# Patient Record
Sex: Female | Born: 1935 | Race: Black or African American | Hispanic: No
Health system: Southern US, Community
[De-identification: ages and names within clinical notes are randomized; demographics above are authoritative.]

---

## 2005-01-23 ENCOUNTER — Encounter: Admission: RE | Admit: 2005-01-23 | Discharge: 2005-01-23 | Payer: Self-pay | Admitting: General Surgery

## 2005-08-18 ENCOUNTER — Encounter: Admission: RE | Admit: 2005-08-18 | Discharge: 2005-08-18 | Payer: Self-pay | Admitting: General Surgery

## 2005-12-22 ENCOUNTER — Encounter: Admission: RE | Admit: 2005-12-22 | Discharge: 2005-12-22 | Payer: Self-pay | Admitting: General Surgery

## 2011-07-15 DIAGNOSIS — E785 Hyperlipidemia, unspecified: Secondary | ICD-10-CM | POA: Diagnosis not present

## 2011-07-15 DIAGNOSIS — D649 Anemia, unspecified: Secondary | ICD-10-CM | POA: Diagnosis not present

## 2011-07-15 DIAGNOSIS — E669 Obesity, unspecified: Secondary | ICD-10-CM | POA: Diagnosis not present

## 2011-09-01 DIAGNOSIS — D649 Anemia, unspecified: Secondary | ICD-10-CM | POA: Diagnosis not present

## 2011-09-01 DIAGNOSIS — M19079 Primary osteoarthritis, unspecified ankle and foot: Secondary | ICD-10-CM | POA: Diagnosis not present

## 2011-09-01 DIAGNOSIS — R0609 Other forms of dyspnea: Secondary | ICD-10-CM | POA: Diagnosis not present

## 2011-09-01 DIAGNOSIS — R0989 Other specified symptoms and signs involving the circulatory and respiratory systems: Secondary | ICD-10-CM | POA: Diagnosis not present

## 2011-09-01 DIAGNOSIS — I10 Essential (primary) hypertension: Secondary | ICD-10-CM | POA: Diagnosis not present

## 2011-09-01 DIAGNOSIS — M2550 Pain in unspecified joint: Secondary | ICD-10-CM | POA: Diagnosis not present

## 2011-09-05 DIAGNOSIS — D649 Anemia, unspecified: Secondary | ICD-10-CM | POA: Diagnosis not present

## 2011-09-05 DIAGNOSIS — D510 Vitamin B12 deficiency anemia due to intrinsic factor deficiency: Secondary | ICD-10-CM | POA: Diagnosis not present

## 2011-09-05 DIAGNOSIS — R7881 Bacteremia: Secondary | ICD-10-CM | POA: Diagnosis not present

## 2011-09-05 DIAGNOSIS — K5900 Constipation, unspecified: Secondary | ICD-10-CM | POA: Diagnosis not present

## 2011-09-05 DIAGNOSIS — M7989 Other specified soft tissue disorders: Secondary | ICD-10-CM | POA: Diagnosis not present

## 2011-09-12 DIAGNOSIS — M79609 Pain in unspecified limb: Secondary | ICD-10-CM | POA: Diagnosis not present

## 2011-09-12 DIAGNOSIS — M7989 Other specified soft tissue disorders: Secondary | ICD-10-CM | POA: Diagnosis not present

## 2011-09-12 DIAGNOSIS — I998 Other disorder of circulatory system: Secondary | ICD-10-CM | POA: Diagnosis not present

## 2011-09-12 DIAGNOSIS — Z79899 Other long term (current) drug therapy: Secondary | ICD-10-CM | POA: Diagnosis not present

## 2011-09-12 DIAGNOSIS — L539 Erythematous condition, unspecified: Secondary | ICD-10-CM | POA: Diagnosis not present

## 2011-09-12 DIAGNOSIS — I10 Essential (primary) hypertension: Secondary | ICD-10-CM | POA: Diagnosis not present

## 2011-09-12 DIAGNOSIS — M7730 Calcaneal spur, unspecified foot: Secondary | ICD-10-CM | POA: Diagnosis not present

## 2011-11-10 DIAGNOSIS — D649 Anemia, unspecified: Secondary | ICD-10-CM | POA: Diagnosis not present

## 2011-11-10 DIAGNOSIS — E559 Vitamin D deficiency, unspecified: Secondary | ICD-10-CM | POA: Diagnosis not present

## 2011-11-25 DIAGNOSIS — I10 Essential (primary) hypertension: Secondary | ICD-10-CM | POA: Diagnosis not present

## 2011-11-25 DIAGNOSIS — D649 Anemia, unspecified: Secondary | ICD-10-CM | POA: Diagnosis not present

## 2011-11-25 DIAGNOSIS — F0390 Unspecified dementia without behavioral disturbance: Secondary | ICD-10-CM | POA: Diagnosis not present

## 2011-12-01 DIAGNOSIS — M129 Arthropathy, unspecified: Secondary | ICD-10-CM | POA: Diagnosis not present

## 2011-12-01 DIAGNOSIS — E785 Hyperlipidemia, unspecified: Secondary | ICD-10-CM | POA: Diagnosis not present

## 2011-12-01 DIAGNOSIS — I10 Essential (primary) hypertension: Secondary | ICD-10-CM | POA: Diagnosis not present

## 2012-05-24 DIAGNOSIS — M25569 Pain in unspecified knee: Secondary | ICD-10-CM | POA: Diagnosis not present

## 2012-05-24 DIAGNOSIS — M1710 Unilateral primary osteoarthritis, unspecified knee: Secondary | ICD-10-CM | POA: Diagnosis not present

## 2012-05-24 DIAGNOSIS — Z23 Encounter for immunization: Secondary | ICD-10-CM | POA: Diagnosis not present

## 2012-05-24 DIAGNOSIS — I10 Essential (primary) hypertension: Secondary | ICD-10-CM | POA: Diagnosis not present

## 2012-05-24 DIAGNOSIS — Z0000 Encounter for general adult medical examination without abnormal findings: Secondary | ICD-10-CM | POA: Diagnosis not present

## 2012-05-24 DIAGNOSIS — N189 Chronic kidney disease, unspecified: Secondary | ICD-10-CM | POA: Diagnosis not present

## 2012-05-27 DIAGNOSIS — Z23 Encounter for immunization: Secondary | ICD-10-CM | POA: Diagnosis not present

## 2012-05-27 DIAGNOSIS — M25569 Pain in unspecified knee: Secondary | ICD-10-CM | POA: Diagnosis not present

## 2012-05-27 DIAGNOSIS — M1710 Unilateral primary osteoarthritis, unspecified knee: Secondary | ICD-10-CM | POA: Diagnosis not present

## 2012-12-09 DIAGNOSIS — M25569 Pain in unspecified knee: Secondary | ICD-10-CM | POA: Diagnosis not present

## 2012-12-09 DIAGNOSIS — Z0000 Encounter for general adult medical examination without abnormal findings: Secondary | ICD-10-CM | POA: Diagnosis not present

## 2012-12-09 DIAGNOSIS — M1710 Unilateral primary osteoarthritis, unspecified knee: Secondary | ICD-10-CM | POA: Diagnosis not present

## 2012-12-09 DIAGNOSIS — I10 Essential (primary) hypertension: Secondary | ICD-10-CM | POA: Diagnosis not present

## 2012-12-09 DIAGNOSIS — M810 Age-related osteoporosis without current pathological fracture: Secondary | ICD-10-CM | POA: Diagnosis not present

## 2012-12-09 DIAGNOSIS — N189 Chronic kidney disease, unspecified: Secondary | ICD-10-CM | POA: Diagnosis not present

## 2012-12-09 DIAGNOSIS — E7800 Pure hypercholesterolemia, unspecified: Secondary | ICD-10-CM | POA: Diagnosis not present

## 2013-03-11 DIAGNOSIS — M25569 Pain in unspecified knee: Secondary | ICD-10-CM | POA: Diagnosis not present

## 2013-03-11 DIAGNOSIS — E7800 Pure hypercholesterolemia, unspecified: Secondary | ICD-10-CM | POA: Diagnosis not present

## 2013-03-11 DIAGNOSIS — I10 Essential (primary) hypertension: Secondary | ICD-10-CM | POA: Diagnosis not present

## 2013-03-11 DIAGNOSIS — E559 Vitamin D deficiency, unspecified: Secondary | ICD-10-CM | POA: Diagnosis not present

## 2013-03-11 DIAGNOSIS — N189 Chronic kidney disease, unspecified: Secondary | ICD-10-CM | POA: Diagnosis not present

## 2013-07-08 DIAGNOSIS — N189 Chronic kidney disease, unspecified: Secondary | ICD-10-CM | POA: Diagnosis not present

## 2013-07-08 DIAGNOSIS — R0602 Shortness of breath: Secondary | ICD-10-CM | POA: Diagnosis not present

## 2013-07-08 DIAGNOSIS — I10 Essential (primary) hypertension: Secondary | ICD-10-CM | POA: Diagnosis not present

## 2013-07-08 DIAGNOSIS — R609 Edema, unspecified: Secondary | ICD-10-CM | POA: Diagnosis not present

## 2013-07-08 DIAGNOSIS — F0390 Unspecified dementia without behavioral disturbance: Secondary | ICD-10-CM | POA: Diagnosis not present

## 2013-07-29 ENCOUNTER — Ambulatory Visit
Admission: RE | Admit: 2013-07-29 | Discharge: 2013-07-29 | Disposition: A | Discharge status: Home / Self Care | Payer: Medicare Other | Source: Ambulatory Visit | Attending: Nephrology | Admitting: Nephrology

## 2013-07-29 ENCOUNTER — Other Ambulatory Visit: Payer: Self-pay | Admitting: Nephrology

## 2013-07-29 DIAGNOSIS — E1129 Type 2 diabetes mellitus with other diabetic kidney complication: Secondary | ICD-10-CM | POA: Diagnosis not present

## 2013-07-29 DIAGNOSIS — R809 Proteinuria, unspecified: Secondary | ICD-10-CM | POA: Diagnosis not present

## 2013-07-29 DIAGNOSIS — N183 Chronic kidney disease, stage 3 unspecified: Secondary | ICD-10-CM

## 2013-07-29 DIAGNOSIS — I129 Hypertensive chronic kidney disease with stage 1 through stage 4 chronic kidney disease, or unspecified chronic kidney disease: Secondary | ICD-10-CM | POA: Diagnosis not present

## 2013-08-05 DIAGNOSIS — E559 Vitamin D deficiency, unspecified: Secondary | ICD-10-CM | POA: Diagnosis not present

## 2013-08-05 DIAGNOSIS — I129 Hypertensive chronic kidney disease with stage 1 through stage 4 chronic kidney disease, or unspecified chronic kidney disease: Secondary | ICD-10-CM | POA: Diagnosis not present

## 2013-08-05 DIAGNOSIS — N183 Chronic kidney disease, stage 3 unspecified: Secondary | ICD-10-CM | POA: Diagnosis not present

## 2014-06-28 DIAGNOSIS — N183 Chronic kidney disease, stage 3 (moderate): Secondary | ICD-10-CM | POA: Diagnosis not present

## 2014-06-28 DIAGNOSIS — I129 Hypertensive chronic kidney disease with stage 1 through stage 4 chronic kidney disease, or unspecified chronic kidney disease: Secondary | ICD-10-CM | POA: Diagnosis not present

## 2014-06-28 DIAGNOSIS — D631 Anemia in chronic kidney disease: Secondary | ICD-10-CM | POA: Diagnosis not present

## 2014-06-28 DIAGNOSIS — R809 Proteinuria, unspecified: Secondary | ICD-10-CM | POA: Diagnosis not present

## 2014-07-10 IMAGING — US US RENAL
1 series · 14 of 25 positions shown · non-contrast
Comparison: None.

CLINICAL DATA: Chronic kidney disease stage 3

EXAM:
RENAL/URINARY TRACT ULTRASOUND COMPLETE

[Series 1: us renal · 0.24mm/px · 14 of 26 slices shown]
[im 1/26]
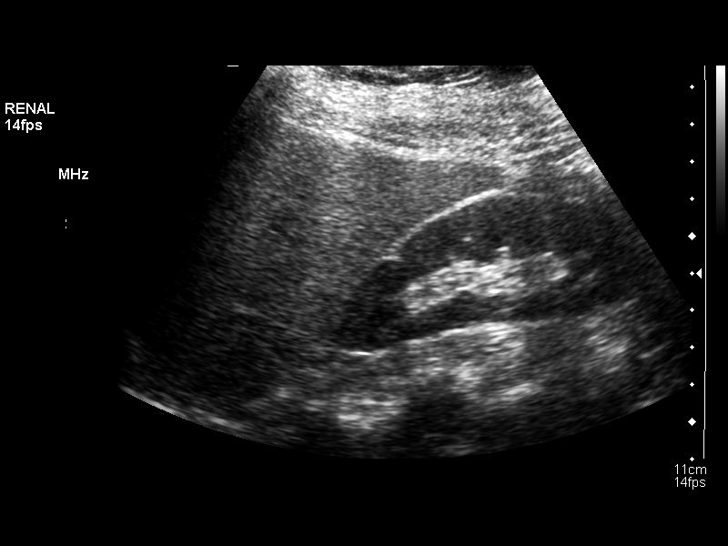
[im 3/26]
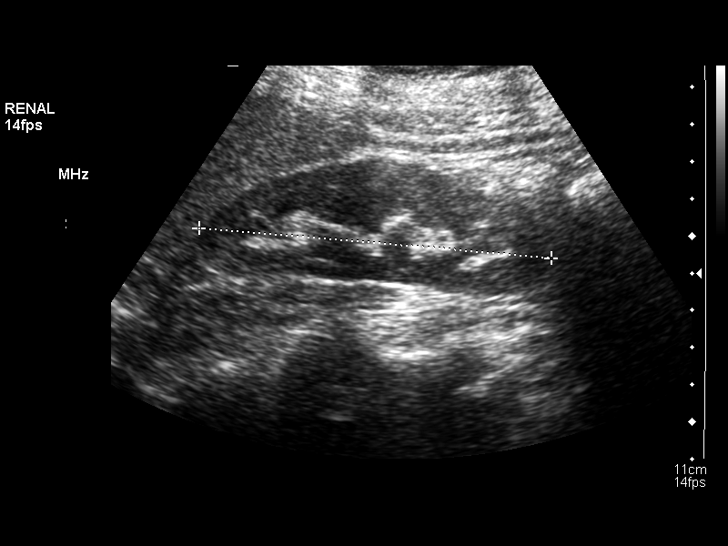
[im 5/26]
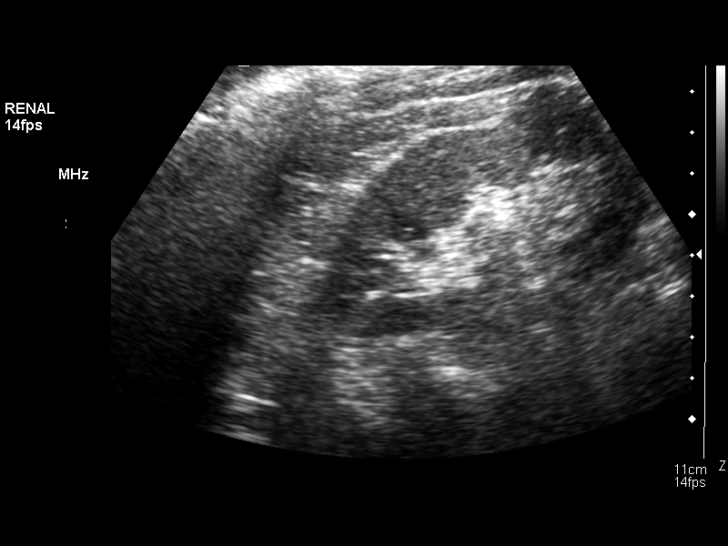
[im 7/26]
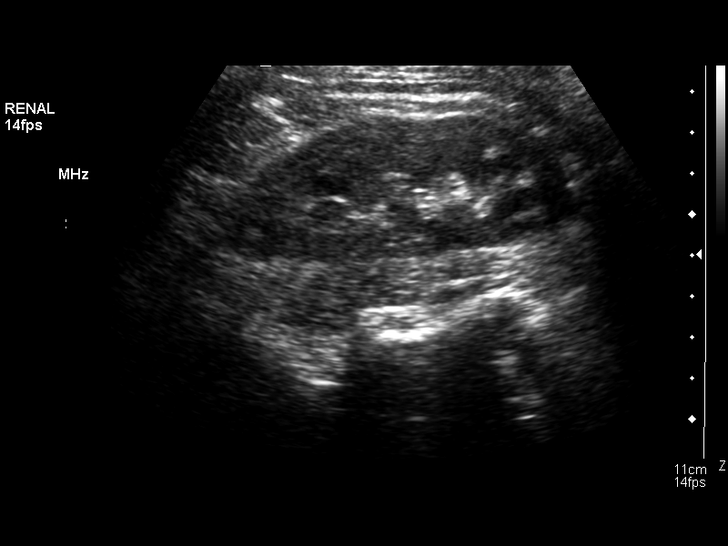
[im 9/26]
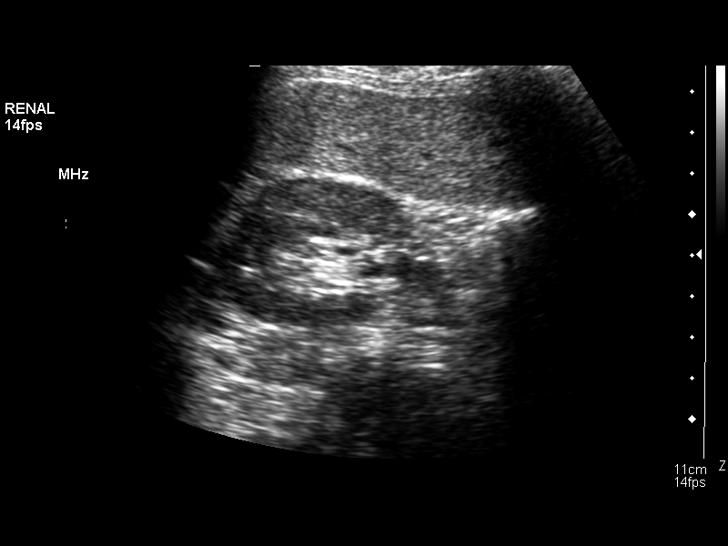
[im 10/26]
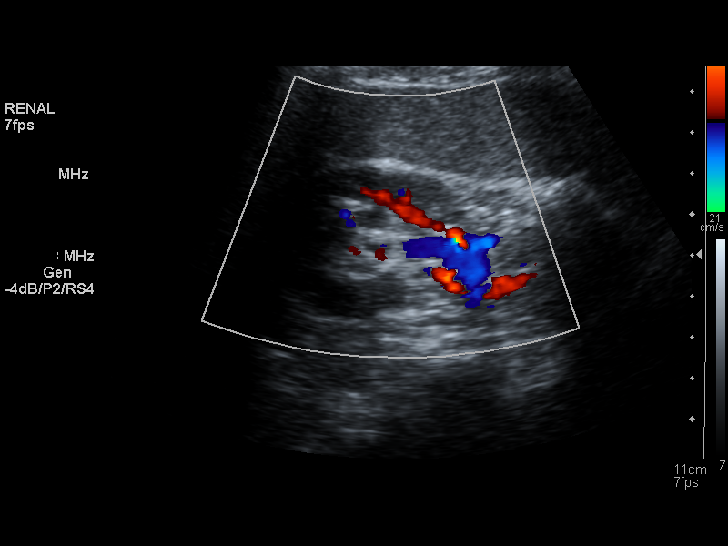
[im 12/26]
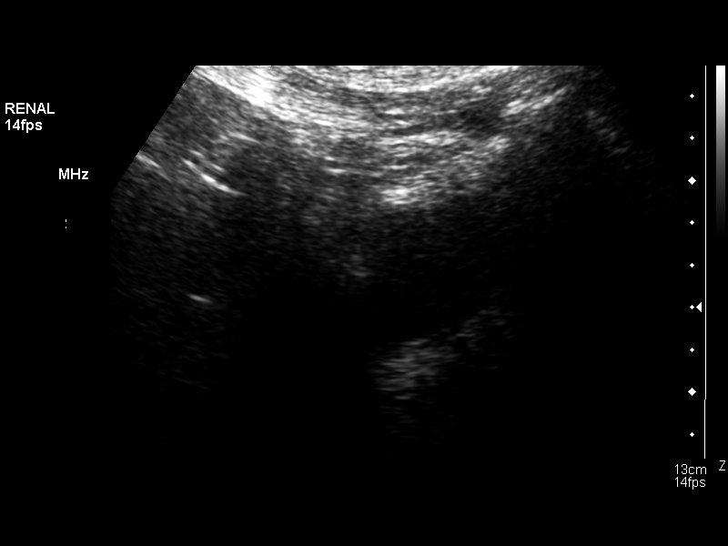
[im 14/26]
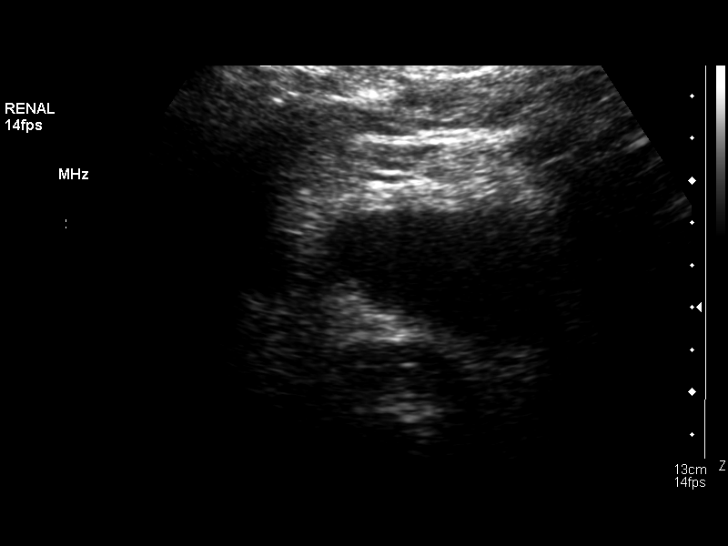
[im 16/26]
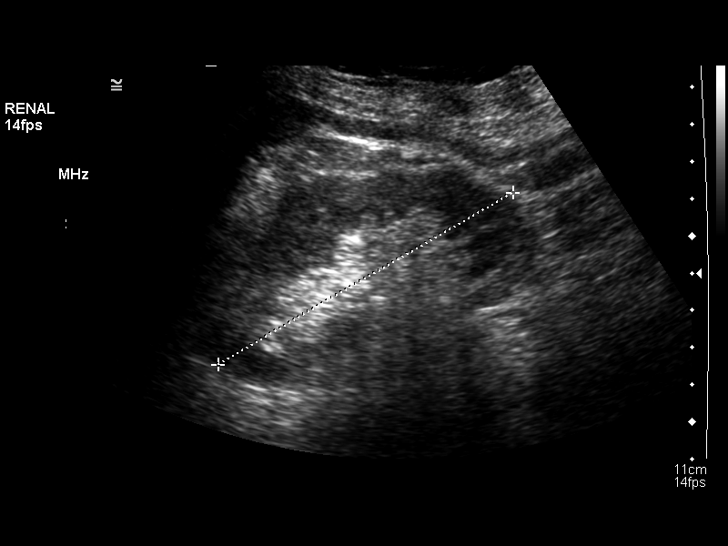
[im 17/26]
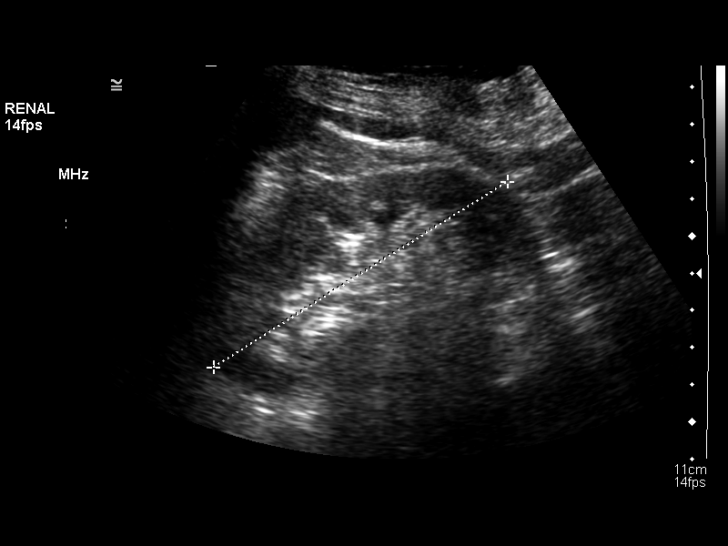
[im 19/26]
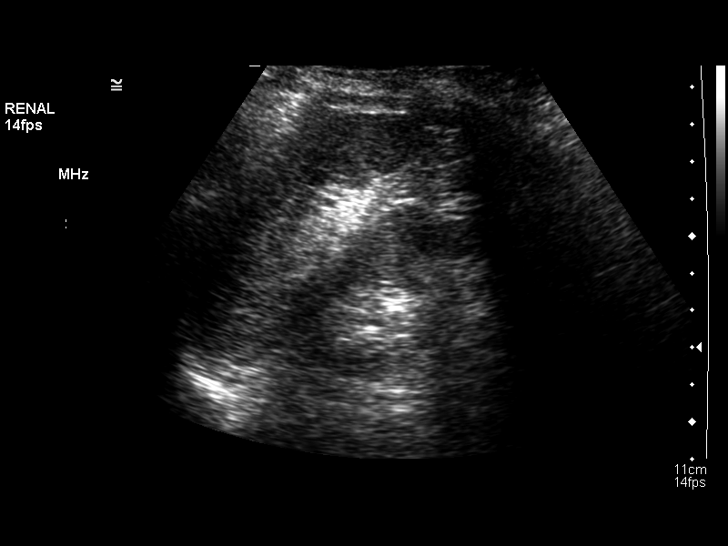
[im 21/26]
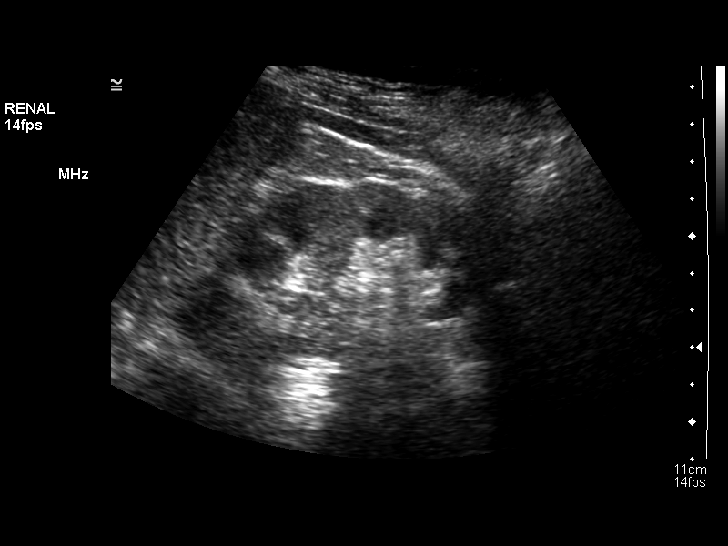
[im 23/26]
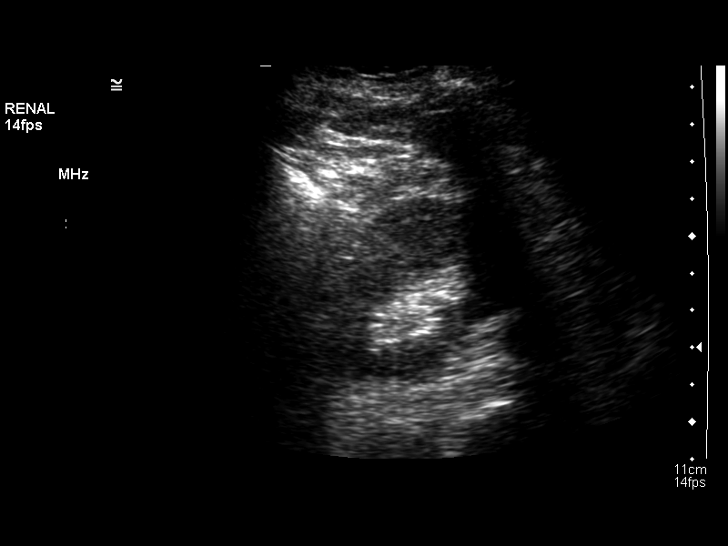
[im 26/26]
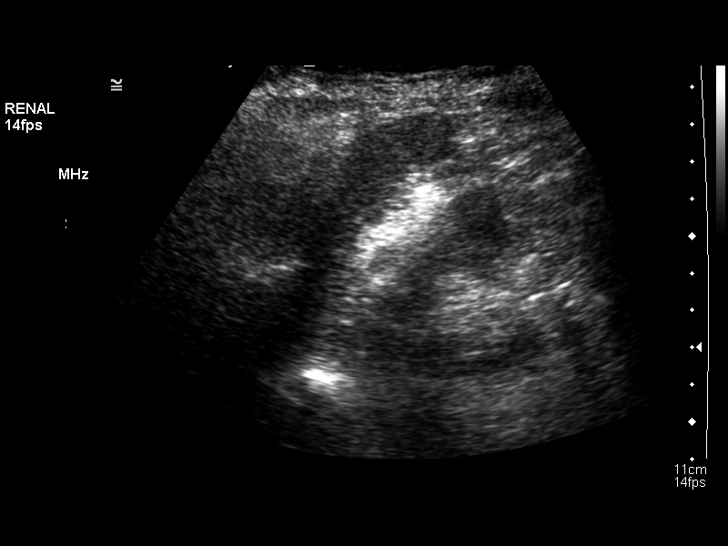

[14 of 25 positions shown; findings below may reference images not displayed]

FINDINGS: Right Kidney:

Length: 9.7 cm.  No mass or hydronephrosis.

Left Kidney:

Length: 9.4 cm.  No mass or hydronephrosis.

Bladder:

Underdistended.
IMPRESSION: Negative renal ultrasound.

## 2014-09-18 DIAGNOSIS — E780 Pure hypercholesterolemia: Secondary | ICD-10-CM | POA: Diagnosis not present

## 2014-09-18 DIAGNOSIS — N189 Chronic kidney disease, unspecified: Secondary | ICD-10-CM | POA: Diagnosis not present

## 2014-09-18 DIAGNOSIS — I10 Essential (primary) hypertension: Secondary | ICD-10-CM | POA: Diagnosis not present

## 2014-09-18 DIAGNOSIS — I129 Hypertensive chronic kidney disease with stage 1 through stage 4 chronic kidney disease, or unspecified chronic kidney disease: Secondary | ICD-10-CM | POA: Diagnosis not present

## 2014-09-18 DIAGNOSIS — R0602 Shortness of breath: Secondary | ICD-10-CM | POA: Diagnosis not present

## 2014-09-18 DIAGNOSIS — F0390 Unspecified dementia without behavioral disturbance: Secondary | ICD-10-CM | POA: Diagnosis not present

## 2015-03-02 DIAGNOSIS — R809 Proteinuria, unspecified: Secondary | ICD-10-CM | POA: Diagnosis not present

## 2015-03-02 DIAGNOSIS — I129 Hypertensive chronic kidney disease with stage 1 through stage 4 chronic kidney disease, or unspecified chronic kidney disease: Secondary | ICD-10-CM | POA: Diagnosis not present

## 2015-03-02 DIAGNOSIS — D631 Anemia in chronic kidney disease: Secondary | ICD-10-CM | POA: Diagnosis not present

## 2015-03-02 DIAGNOSIS — N2581 Secondary hyperparathyroidism of renal origin: Secondary | ICD-10-CM | POA: Diagnosis not present

## 2015-03-02 DIAGNOSIS — N189 Chronic kidney disease, unspecified: Secondary | ICD-10-CM | POA: Diagnosis not present

## 2015-03-02 DIAGNOSIS — N183 Chronic kidney disease, stage 3 (moderate): Secondary | ICD-10-CM | POA: Diagnosis not present

## 2015-03-02 DIAGNOSIS — E1129 Type 2 diabetes mellitus with other diabetic kidney complication: Secondary | ICD-10-CM | POA: Diagnosis not present

## 2015-03-15 DIAGNOSIS — F0390 Unspecified dementia without behavioral disturbance: Secondary | ICD-10-CM | POA: Diagnosis not present

## 2015-03-15 DIAGNOSIS — I10 Essential (primary) hypertension: Secondary | ICD-10-CM | POA: Diagnosis not present

## 2015-03-15 DIAGNOSIS — Z23 Encounter for immunization: Secondary | ICD-10-CM | POA: Diagnosis not present

## 2015-09-13 DIAGNOSIS — N183 Chronic kidney disease, stage 3 (moderate): Secondary | ICD-10-CM | POA: Diagnosis not present

## 2015-09-13 DIAGNOSIS — G309 Alzheimer's disease, unspecified: Secondary | ICD-10-CM | POA: Diagnosis not present

## 2015-09-13 DIAGNOSIS — I10 Essential (primary) hypertension: Secondary | ICD-10-CM | POA: Diagnosis not present

## 2015-09-13 DIAGNOSIS — N189 Chronic kidney disease, unspecified: Secondary | ICD-10-CM | POA: Diagnosis not present

## 2015-11-08 DIAGNOSIS — R809 Proteinuria, unspecified: Secondary | ICD-10-CM | POA: Diagnosis not present

## 2015-11-08 DIAGNOSIS — N2581 Secondary hyperparathyroidism of renal origin: Secondary | ICD-10-CM | POA: Diagnosis not present

## 2015-11-08 DIAGNOSIS — N189 Chronic kidney disease, unspecified: Secondary | ICD-10-CM | POA: Diagnosis not present

## 2015-11-08 DIAGNOSIS — D631 Anemia in chronic kidney disease: Secondary | ICD-10-CM | POA: Diagnosis not present

## 2015-11-08 DIAGNOSIS — E1129 Type 2 diabetes mellitus with other diabetic kidney complication: Secondary | ICD-10-CM | POA: Diagnosis not present

## 2015-11-08 DIAGNOSIS — E669 Obesity, unspecified: Secondary | ICD-10-CM | POA: Diagnosis not present

## 2015-11-08 DIAGNOSIS — I129 Hypertensive chronic kidney disease with stage 1 through stage 4 chronic kidney disease, or unspecified chronic kidney disease: Secondary | ICD-10-CM | POA: Diagnosis not present

## 2015-11-08 DIAGNOSIS — N183 Chronic kidney disease, stage 3 (moderate): Secondary | ICD-10-CM | POA: Diagnosis not present

## 2015-11-08 DIAGNOSIS — E7800 Pure hypercholesterolemia, unspecified: Secondary | ICD-10-CM | POA: Diagnosis not present

## 2016-02-26 ENCOUNTER — Other Ambulatory Visit: Payer: Self-pay

## 2016-03-13 DIAGNOSIS — N183 Chronic kidney disease, stage 3 (moderate): Secondary | ICD-10-CM | POA: Diagnosis not present

## 2016-03-13 DIAGNOSIS — G309 Alzheimer's disease, unspecified: Secondary | ICD-10-CM | POA: Diagnosis not present

## 2016-03-13 DIAGNOSIS — Z6833 Body mass index (BMI) 33.0-33.9, adult: Secondary | ICD-10-CM | POA: Diagnosis not present

## 2016-03-13 DIAGNOSIS — E7800 Pure hypercholesterolemia, unspecified: Secondary | ICD-10-CM | POA: Diagnosis not present

## 2016-03-13 DIAGNOSIS — Z23 Encounter for immunization: Secondary | ICD-10-CM | POA: Diagnosis not present

## 2016-07-04 DIAGNOSIS — I129 Hypertensive chronic kidney disease with stage 1 through stage 4 chronic kidney disease, or unspecified chronic kidney disease: Secondary | ICD-10-CM | POA: Diagnosis not present

## 2016-07-04 DIAGNOSIS — N2581 Secondary hyperparathyroidism of renal origin: Secondary | ICD-10-CM | POA: Diagnosis not present

## 2016-07-04 DIAGNOSIS — N189 Chronic kidney disease, unspecified: Secondary | ICD-10-CM | POA: Diagnosis not present

## 2016-07-04 DIAGNOSIS — F0390 Unspecified dementia without behavioral disturbance: Secondary | ICD-10-CM | POA: Diagnosis not present

## 2016-07-04 DIAGNOSIS — E669 Obesity, unspecified: Secondary | ICD-10-CM | POA: Diagnosis not present

## 2016-07-04 DIAGNOSIS — E7800 Pure hypercholesterolemia, unspecified: Secondary | ICD-10-CM | POA: Diagnosis not present

## 2016-07-04 DIAGNOSIS — D631 Anemia in chronic kidney disease: Secondary | ICD-10-CM | POA: Diagnosis not present

## 2016-07-04 DIAGNOSIS — E1129 Type 2 diabetes mellitus with other diabetic kidney complication: Secondary | ICD-10-CM | POA: Diagnosis not present

## 2016-07-04 DIAGNOSIS — R809 Proteinuria, unspecified: Secondary | ICD-10-CM | POA: Diagnosis not present

## 2016-07-04 DIAGNOSIS — N183 Chronic kidney disease, stage 3 (moderate): Secondary | ICD-10-CM | POA: Diagnosis not present

## 2016-07-05 DIAGNOSIS — T1490XA Injury, unspecified, initial encounter: Secondary | ICD-10-CM | POA: Diagnosis not present

## 2016-10-10 DIAGNOSIS — N183 Chronic kidney disease, stage 3 (moderate): Secondary | ICD-10-CM | POA: Diagnosis not present

## 2016-10-10 DIAGNOSIS — E7800 Pure hypercholesterolemia, unspecified: Secondary | ICD-10-CM | POA: Diagnosis not present

## 2016-10-10 DIAGNOSIS — G309 Alzheimer's disease, unspecified: Secondary | ICD-10-CM | POA: Diagnosis not present

## 2016-10-10 DIAGNOSIS — Z6833 Body mass index (BMI) 33.0-33.9, adult: Secondary | ICD-10-CM | POA: Diagnosis not present

## 2016-10-31 DIAGNOSIS — M25561 Pain in right knee: Secondary | ICD-10-CM | POA: Diagnosis not present

## 2016-10-31 DIAGNOSIS — M1991 Primary osteoarthritis, unspecified site: Secondary | ICD-10-CM | POA: Diagnosis not present

## 2016-10-31 DIAGNOSIS — G309 Alzheimer's disease, unspecified: Secondary | ICD-10-CM | POA: Diagnosis not present

## 2016-10-31 DIAGNOSIS — I129 Hypertensive chronic kidney disease with stage 1 through stage 4 chronic kidney disease, or unspecified chronic kidney disease: Secondary | ICD-10-CM | POA: Diagnosis not present

## 2016-10-31 DIAGNOSIS — F0280 Dementia in other diseases classified elsewhere without behavioral disturbance: Secondary | ICD-10-CM | POA: Diagnosis not present

## 2016-10-31 DIAGNOSIS — R269 Unspecified abnormalities of gait and mobility: Secondary | ICD-10-CM | POA: Diagnosis not present

## 2016-10-31 DIAGNOSIS — M25562 Pain in left knee: Secondary | ICD-10-CM | POA: Diagnosis not present

## 2016-10-31 DIAGNOSIS — N183 Chronic kidney disease, stage 3 (moderate): Secondary | ICD-10-CM | POA: Diagnosis not present

## 2016-11-03 DIAGNOSIS — G309 Alzheimer's disease, unspecified: Secondary | ICD-10-CM | POA: Diagnosis not present

## 2016-11-03 DIAGNOSIS — I129 Hypertensive chronic kidney disease with stage 1 through stage 4 chronic kidney disease, or unspecified chronic kidney disease: Secondary | ICD-10-CM | POA: Diagnosis not present

## 2016-11-03 DIAGNOSIS — N183 Chronic kidney disease, stage 3 (moderate): Secondary | ICD-10-CM | POA: Diagnosis not present

## 2016-11-03 DIAGNOSIS — M1991 Primary osteoarthritis, unspecified site: Secondary | ICD-10-CM | POA: Diagnosis not present

## 2016-11-03 DIAGNOSIS — F0280 Dementia in other diseases classified elsewhere without behavioral disturbance: Secondary | ICD-10-CM | POA: Diagnosis not present

## 2016-11-03 DIAGNOSIS — R269 Unspecified abnormalities of gait and mobility: Secondary | ICD-10-CM | POA: Diagnosis not present

## 2016-11-04 DIAGNOSIS — I129 Hypertensive chronic kidney disease with stage 1 through stage 4 chronic kidney disease, or unspecified chronic kidney disease: Secondary | ICD-10-CM | POA: Diagnosis not present

## 2016-11-04 DIAGNOSIS — M1991 Primary osteoarthritis, unspecified site: Secondary | ICD-10-CM | POA: Diagnosis not present

## 2016-11-04 DIAGNOSIS — G309 Alzheimer's disease, unspecified: Secondary | ICD-10-CM | POA: Diagnosis not present

## 2016-11-04 DIAGNOSIS — R269 Unspecified abnormalities of gait and mobility: Secondary | ICD-10-CM | POA: Diagnosis not present

## 2016-11-04 DIAGNOSIS — N183 Chronic kidney disease, stage 3 (moderate): Secondary | ICD-10-CM | POA: Diagnosis not present

## 2016-11-04 DIAGNOSIS — F0280 Dementia in other diseases classified elsewhere without behavioral disturbance: Secondary | ICD-10-CM | POA: Diagnosis not present

## 2016-11-06 DIAGNOSIS — G309 Alzheimer's disease, unspecified: Secondary | ICD-10-CM | POA: Diagnosis not present

## 2016-11-06 DIAGNOSIS — F0280 Dementia in other diseases classified elsewhere without behavioral disturbance: Secondary | ICD-10-CM | POA: Diagnosis not present

## 2016-11-06 DIAGNOSIS — I129 Hypertensive chronic kidney disease with stage 1 through stage 4 chronic kidney disease, or unspecified chronic kidney disease: Secondary | ICD-10-CM | POA: Diagnosis not present

## 2016-11-06 DIAGNOSIS — M1991 Primary osteoarthritis, unspecified site: Secondary | ICD-10-CM | POA: Diagnosis not present

## 2016-11-06 DIAGNOSIS — N183 Chronic kidney disease, stage 3 (moderate): Secondary | ICD-10-CM | POA: Diagnosis not present

## 2016-11-06 DIAGNOSIS — R269 Unspecified abnormalities of gait and mobility: Secondary | ICD-10-CM | POA: Diagnosis not present

## 2016-11-07 DIAGNOSIS — F0280 Dementia in other diseases classified elsewhere without behavioral disturbance: Secondary | ICD-10-CM | POA: Diagnosis not present

## 2016-11-07 DIAGNOSIS — R269 Unspecified abnormalities of gait and mobility: Secondary | ICD-10-CM | POA: Diagnosis not present

## 2016-11-07 DIAGNOSIS — N183 Chronic kidney disease, stage 3 (moderate): Secondary | ICD-10-CM | POA: Diagnosis not present

## 2016-11-07 DIAGNOSIS — G309 Alzheimer's disease, unspecified: Secondary | ICD-10-CM | POA: Diagnosis not present

## 2016-11-07 DIAGNOSIS — M1991 Primary osteoarthritis, unspecified site: Secondary | ICD-10-CM | POA: Diagnosis not present

## 2016-11-07 DIAGNOSIS — I129 Hypertensive chronic kidney disease with stage 1 through stage 4 chronic kidney disease, or unspecified chronic kidney disease: Secondary | ICD-10-CM | POA: Diagnosis not present

## 2016-11-10 ENCOUNTER — Telehealth: Payer: Self-pay | Admitting: Family Medicine

## 2016-11-10 NOTE — Telephone Encounter (Signed)
Error

## 2016-11-11 DIAGNOSIS — N183 Chronic kidney disease, stage 3 (moderate): Secondary | ICD-10-CM | POA: Diagnosis not present

## 2016-11-11 DIAGNOSIS — R269 Unspecified abnormalities of gait and mobility: Secondary | ICD-10-CM | POA: Diagnosis not present

## 2016-11-11 DIAGNOSIS — F0280 Dementia in other diseases classified elsewhere without behavioral disturbance: Secondary | ICD-10-CM | POA: Diagnosis not present

## 2016-11-11 DIAGNOSIS — M1991 Primary osteoarthritis, unspecified site: Secondary | ICD-10-CM | POA: Diagnosis not present

## 2016-11-11 DIAGNOSIS — G309 Alzheimer's disease, unspecified: Secondary | ICD-10-CM | POA: Diagnosis not present

## 2016-11-11 DIAGNOSIS — I129 Hypertensive chronic kidney disease with stage 1 through stage 4 chronic kidney disease, or unspecified chronic kidney disease: Secondary | ICD-10-CM | POA: Diagnosis not present

## 2016-11-12 DIAGNOSIS — R269 Unspecified abnormalities of gait and mobility: Secondary | ICD-10-CM | POA: Diagnosis not present

## 2016-11-12 DIAGNOSIS — I129 Hypertensive chronic kidney disease with stage 1 through stage 4 chronic kidney disease, or unspecified chronic kidney disease: Secondary | ICD-10-CM | POA: Diagnosis not present

## 2016-11-12 DIAGNOSIS — M1991 Primary osteoarthritis, unspecified site: Secondary | ICD-10-CM | POA: Diagnosis not present

## 2016-11-12 DIAGNOSIS — G309 Alzheimer's disease, unspecified: Secondary | ICD-10-CM | POA: Diagnosis not present

## 2016-11-12 DIAGNOSIS — N183 Chronic kidney disease, stage 3 (moderate): Secondary | ICD-10-CM | POA: Diagnosis not present

## 2016-11-12 DIAGNOSIS — F0280 Dementia in other diseases classified elsewhere without behavioral disturbance: Secondary | ICD-10-CM | POA: Diagnosis not present

## 2016-11-13 DIAGNOSIS — F0280 Dementia in other diseases classified elsewhere without behavioral disturbance: Secondary | ICD-10-CM | POA: Diagnosis not present

## 2016-11-13 DIAGNOSIS — N183 Chronic kidney disease, stage 3 (moderate): Secondary | ICD-10-CM | POA: Diagnosis not present

## 2016-11-13 DIAGNOSIS — G309 Alzheimer's disease, unspecified: Secondary | ICD-10-CM | POA: Diagnosis not present

## 2016-11-13 DIAGNOSIS — I129 Hypertensive chronic kidney disease with stage 1 through stage 4 chronic kidney disease, or unspecified chronic kidney disease: Secondary | ICD-10-CM | POA: Diagnosis not present

## 2016-11-13 DIAGNOSIS — R269 Unspecified abnormalities of gait and mobility: Secondary | ICD-10-CM | POA: Diagnosis not present

## 2016-11-13 DIAGNOSIS — M1991 Primary osteoarthritis, unspecified site: Secondary | ICD-10-CM | POA: Diagnosis not present

## 2016-11-14 DIAGNOSIS — R269 Unspecified abnormalities of gait and mobility: Secondary | ICD-10-CM | POA: Diagnosis not present

## 2016-11-14 DIAGNOSIS — N183 Chronic kidney disease, stage 3 (moderate): Secondary | ICD-10-CM | POA: Diagnosis not present

## 2016-11-14 DIAGNOSIS — F0280 Dementia in other diseases classified elsewhere without behavioral disturbance: Secondary | ICD-10-CM | POA: Diagnosis not present

## 2016-11-14 DIAGNOSIS — M1991 Primary osteoarthritis, unspecified site: Secondary | ICD-10-CM | POA: Diagnosis not present

## 2016-11-14 DIAGNOSIS — G309 Alzheimer's disease, unspecified: Secondary | ICD-10-CM | POA: Diagnosis not present

## 2016-11-14 DIAGNOSIS — I129 Hypertensive chronic kidney disease with stage 1 through stage 4 chronic kidney disease, or unspecified chronic kidney disease: Secondary | ICD-10-CM | POA: Diagnosis not present

## 2016-11-18 DIAGNOSIS — F0280 Dementia in other diseases classified elsewhere without behavioral disturbance: Secondary | ICD-10-CM | POA: Diagnosis not present

## 2016-11-18 DIAGNOSIS — G309 Alzheimer's disease, unspecified: Secondary | ICD-10-CM | POA: Diagnosis not present

## 2016-11-18 DIAGNOSIS — M1991 Primary osteoarthritis, unspecified site: Secondary | ICD-10-CM | POA: Diagnosis not present

## 2016-11-18 DIAGNOSIS — N183 Chronic kidney disease, stage 3 (moderate): Secondary | ICD-10-CM | POA: Diagnosis not present

## 2016-11-18 DIAGNOSIS — R269 Unspecified abnormalities of gait and mobility: Secondary | ICD-10-CM | POA: Diagnosis not present

## 2016-11-18 DIAGNOSIS — I129 Hypertensive chronic kidney disease with stage 1 through stage 4 chronic kidney disease, or unspecified chronic kidney disease: Secondary | ICD-10-CM | POA: Diagnosis not present

## 2016-11-19 DIAGNOSIS — N183 Chronic kidney disease, stage 3 (moderate): Secondary | ICD-10-CM | POA: Diagnosis not present

## 2016-11-19 DIAGNOSIS — F0280 Dementia in other diseases classified elsewhere without behavioral disturbance: Secondary | ICD-10-CM | POA: Diagnosis not present

## 2016-11-19 DIAGNOSIS — M1991 Primary osteoarthritis, unspecified site: Secondary | ICD-10-CM | POA: Diagnosis not present

## 2016-11-19 DIAGNOSIS — G309 Alzheimer's disease, unspecified: Secondary | ICD-10-CM | POA: Diagnosis not present

## 2016-11-19 DIAGNOSIS — I129 Hypertensive chronic kidney disease with stage 1 through stage 4 chronic kidney disease, or unspecified chronic kidney disease: Secondary | ICD-10-CM | POA: Diagnosis not present

## 2016-11-19 DIAGNOSIS — R269 Unspecified abnormalities of gait and mobility: Secondary | ICD-10-CM | POA: Diagnosis not present

## 2016-11-20 DIAGNOSIS — I129 Hypertensive chronic kidney disease with stage 1 through stage 4 chronic kidney disease, or unspecified chronic kidney disease: Secondary | ICD-10-CM | POA: Diagnosis not present

## 2016-11-20 DIAGNOSIS — G309 Alzheimer's disease, unspecified: Secondary | ICD-10-CM | POA: Diagnosis not present

## 2016-11-20 DIAGNOSIS — N183 Chronic kidney disease, stage 3 (moderate): Secondary | ICD-10-CM | POA: Diagnosis not present

## 2016-11-20 DIAGNOSIS — F0280 Dementia in other diseases classified elsewhere without behavioral disturbance: Secondary | ICD-10-CM | POA: Diagnosis not present

## 2016-11-20 DIAGNOSIS — R269 Unspecified abnormalities of gait and mobility: Secondary | ICD-10-CM | POA: Diagnosis not present

## 2016-11-20 DIAGNOSIS — M1991 Primary osteoarthritis, unspecified site: Secondary | ICD-10-CM | POA: Diagnosis not present

## 2016-11-21 DIAGNOSIS — G309 Alzheimer's disease, unspecified: Secondary | ICD-10-CM | POA: Diagnosis not present

## 2016-11-21 DIAGNOSIS — R269 Unspecified abnormalities of gait and mobility: Secondary | ICD-10-CM | POA: Diagnosis not present

## 2016-11-21 DIAGNOSIS — I129 Hypertensive chronic kidney disease with stage 1 through stage 4 chronic kidney disease, or unspecified chronic kidney disease: Secondary | ICD-10-CM | POA: Diagnosis not present

## 2016-11-21 DIAGNOSIS — F0280 Dementia in other diseases classified elsewhere without behavioral disturbance: Secondary | ICD-10-CM | POA: Diagnosis not present

## 2016-11-21 DIAGNOSIS — M1991 Primary osteoarthritis, unspecified site: Secondary | ICD-10-CM | POA: Diagnosis not present

## 2016-11-21 DIAGNOSIS — N183 Chronic kidney disease, stage 3 (moderate): Secondary | ICD-10-CM | POA: Diagnosis not present

## 2016-11-25 DIAGNOSIS — M1991 Primary osteoarthritis, unspecified site: Secondary | ICD-10-CM | POA: Diagnosis not present

## 2016-11-25 DIAGNOSIS — G309 Alzheimer's disease, unspecified: Secondary | ICD-10-CM | POA: Diagnosis not present

## 2016-11-25 DIAGNOSIS — F0280 Dementia in other diseases classified elsewhere without behavioral disturbance: Secondary | ICD-10-CM | POA: Diagnosis not present

## 2016-11-25 DIAGNOSIS — R269 Unspecified abnormalities of gait and mobility: Secondary | ICD-10-CM | POA: Diagnosis not present

## 2016-11-25 DIAGNOSIS — N183 Chronic kidney disease, stage 3 (moderate): Secondary | ICD-10-CM | POA: Diagnosis not present

## 2016-11-25 DIAGNOSIS — I129 Hypertensive chronic kidney disease with stage 1 through stage 4 chronic kidney disease, or unspecified chronic kidney disease: Secondary | ICD-10-CM | POA: Diagnosis not present

## 2016-11-26 DIAGNOSIS — M1991 Primary osteoarthritis, unspecified site: Secondary | ICD-10-CM | POA: Diagnosis not present

## 2016-11-26 DIAGNOSIS — I129 Hypertensive chronic kidney disease with stage 1 through stage 4 chronic kidney disease, or unspecified chronic kidney disease: Secondary | ICD-10-CM | POA: Diagnosis not present

## 2016-11-26 DIAGNOSIS — N183 Chronic kidney disease, stage 3 (moderate): Secondary | ICD-10-CM | POA: Diagnosis not present

## 2016-11-26 DIAGNOSIS — G309 Alzheimer's disease, unspecified: Secondary | ICD-10-CM | POA: Diagnosis not present

## 2016-11-26 DIAGNOSIS — F0280 Dementia in other diseases classified elsewhere without behavioral disturbance: Secondary | ICD-10-CM | POA: Diagnosis not present

## 2016-11-26 DIAGNOSIS — R269 Unspecified abnormalities of gait and mobility: Secondary | ICD-10-CM | POA: Diagnosis not present

## 2016-11-28 DIAGNOSIS — R269 Unspecified abnormalities of gait and mobility: Secondary | ICD-10-CM | POA: Diagnosis not present

## 2016-11-28 DIAGNOSIS — M25562 Pain in left knee: Secondary | ICD-10-CM | POA: Diagnosis not present

## 2016-11-28 DIAGNOSIS — M1991 Primary osteoarthritis, unspecified site: Secondary | ICD-10-CM | POA: Diagnosis not present

## 2016-11-28 DIAGNOSIS — F0280 Dementia in other diseases classified elsewhere without behavioral disturbance: Secondary | ICD-10-CM | POA: Diagnosis not present

## 2016-11-28 DIAGNOSIS — I129 Hypertensive chronic kidney disease with stage 1 through stage 4 chronic kidney disease, or unspecified chronic kidney disease: Secondary | ICD-10-CM | POA: Diagnosis not present

## 2016-11-28 DIAGNOSIS — G309 Alzheimer's disease, unspecified: Secondary | ICD-10-CM | POA: Diagnosis not present

## 2016-11-28 DIAGNOSIS — M25561 Pain in right knee: Secondary | ICD-10-CM | POA: Diagnosis not present

## 2016-11-28 DIAGNOSIS — N183 Chronic kidney disease, stage 3 (moderate): Secondary | ICD-10-CM | POA: Diagnosis not present

## 2016-12-02 DIAGNOSIS — G309 Alzheimer's disease, unspecified: Secondary | ICD-10-CM | POA: Diagnosis not present

## 2016-12-02 DIAGNOSIS — R269 Unspecified abnormalities of gait and mobility: Secondary | ICD-10-CM | POA: Diagnosis not present

## 2016-12-02 DIAGNOSIS — M1991 Primary osteoarthritis, unspecified site: Secondary | ICD-10-CM | POA: Diagnosis not present

## 2016-12-02 DIAGNOSIS — I129 Hypertensive chronic kidney disease with stage 1 through stage 4 chronic kidney disease, or unspecified chronic kidney disease: Secondary | ICD-10-CM | POA: Diagnosis not present

## 2016-12-02 DIAGNOSIS — N183 Chronic kidney disease, stage 3 (moderate): Secondary | ICD-10-CM | POA: Diagnosis not present

## 2016-12-02 DIAGNOSIS — F0280 Dementia in other diseases classified elsewhere without behavioral disturbance: Secondary | ICD-10-CM | POA: Diagnosis not present

## 2016-12-04 DIAGNOSIS — N183 Chronic kidney disease, stage 3 (moderate): Secondary | ICD-10-CM | POA: Diagnosis not present

## 2016-12-04 DIAGNOSIS — F0280 Dementia in other diseases classified elsewhere without behavioral disturbance: Secondary | ICD-10-CM | POA: Diagnosis not present

## 2016-12-04 DIAGNOSIS — M1991 Primary osteoarthritis, unspecified site: Secondary | ICD-10-CM | POA: Diagnosis not present

## 2016-12-04 DIAGNOSIS — R269 Unspecified abnormalities of gait and mobility: Secondary | ICD-10-CM | POA: Diagnosis not present

## 2016-12-04 DIAGNOSIS — G309 Alzheimer's disease, unspecified: Secondary | ICD-10-CM | POA: Diagnosis not present

## 2016-12-04 DIAGNOSIS — I129 Hypertensive chronic kidney disease with stage 1 through stage 4 chronic kidney disease, or unspecified chronic kidney disease: Secondary | ICD-10-CM | POA: Diagnosis not present

## 2016-12-05 DIAGNOSIS — F0280 Dementia in other diseases classified elsewhere without behavioral disturbance: Secondary | ICD-10-CM | POA: Diagnosis not present

## 2016-12-05 DIAGNOSIS — N183 Chronic kidney disease, stage 3 (moderate): Secondary | ICD-10-CM | POA: Diagnosis not present

## 2016-12-05 DIAGNOSIS — M1991 Primary osteoarthritis, unspecified site: Secondary | ICD-10-CM | POA: Diagnosis not present

## 2016-12-05 DIAGNOSIS — G309 Alzheimer's disease, unspecified: Secondary | ICD-10-CM | POA: Diagnosis not present

## 2016-12-05 DIAGNOSIS — R269 Unspecified abnormalities of gait and mobility: Secondary | ICD-10-CM | POA: Diagnosis not present

## 2016-12-05 DIAGNOSIS — I129 Hypertensive chronic kidney disease with stage 1 through stage 4 chronic kidney disease, or unspecified chronic kidney disease: Secondary | ICD-10-CM | POA: Diagnosis not present

## 2017-04-08 ENCOUNTER — Other Ambulatory Visit: Payer: Self-pay | Admitting: Family Medicine

## 2017-04-08 ENCOUNTER — Other Ambulatory Visit: Payer: Self-pay

## 2017-10-20 DIAGNOSIS — E7800 Pure hypercholesterolemia, unspecified: Secondary | ICD-10-CM | POA: Diagnosis not present

## 2017-10-20 DIAGNOSIS — R600 Localized edema: Secondary | ICD-10-CM | POA: Diagnosis not present

## 2017-10-20 DIAGNOSIS — N183 Chronic kidney disease, stage 3 (moderate): Secondary | ICD-10-CM | POA: Diagnosis not present

## 2017-10-20 DIAGNOSIS — Z6833 Body mass index (BMI) 33.0-33.9, adult: Secondary | ICD-10-CM | POA: Diagnosis not present

## 2017-10-20 DIAGNOSIS — J309 Allergic rhinitis, unspecified: Secondary | ICD-10-CM | POA: Diagnosis not present

## 2017-10-20 DIAGNOSIS — G309 Alzheimer's disease, unspecified: Secondary | ICD-10-CM | POA: Diagnosis not present

## 2018-01-04 DIAGNOSIS — N183 Chronic kidney disease, stage 3 (moderate): Secondary | ICD-10-CM | POA: Diagnosis not present

## 2019-05-24 DEATH — deceased
